# Patient Record
Sex: Female | Born: 1985 | Race: White | Hispanic: No | Marital: Married | Smoking: Never smoker
Health system: Southern US, Community
[De-identification: ages and names within clinical notes are randomized; demographics above are authoritative.]

## PROBLEM LIST (undated history)

## (undated) DIAGNOSIS — I959 Hypotension, unspecified: Secondary | ICD-10-CM

## (undated) DIAGNOSIS — Z789 Other specified health status: Secondary | ICD-10-CM

## (undated) HISTORY — DX: Other specified health status: Z78.9

## (undated) HISTORY — DX: Hypotension, unspecified: I95.9

---

## 2008-03-28 ENCOUNTER — Observation Stay: Payer: Self-pay | Admitting: Obstetrics and Gynecology

## 2008-04-23 ENCOUNTER — Observation Stay: Payer: Self-pay | Admitting: Obstetrics and Gynecology

## 2008-04-29 ENCOUNTER — Inpatient Hospital Stay: Payer: Self-pay | Admitting: Obstetrics and Gynecology

## 2011-06-13 ENCOUNTER — Ambulatory Visit: Payer: Self-pay | Admitting: Internal Medicine

## 2011-11-01 IMAGING — CR DG ABDOMEN 2V
1 series · 2 of 2 positions shown · non-contrast
Comparison: none

REASON FOR EXAM: RLQ pain and diarrhea
COMMENTS:

[Series 1: view not recorded · 0.17mm/px · 2 of 2 slices shown]
[im 1/2]
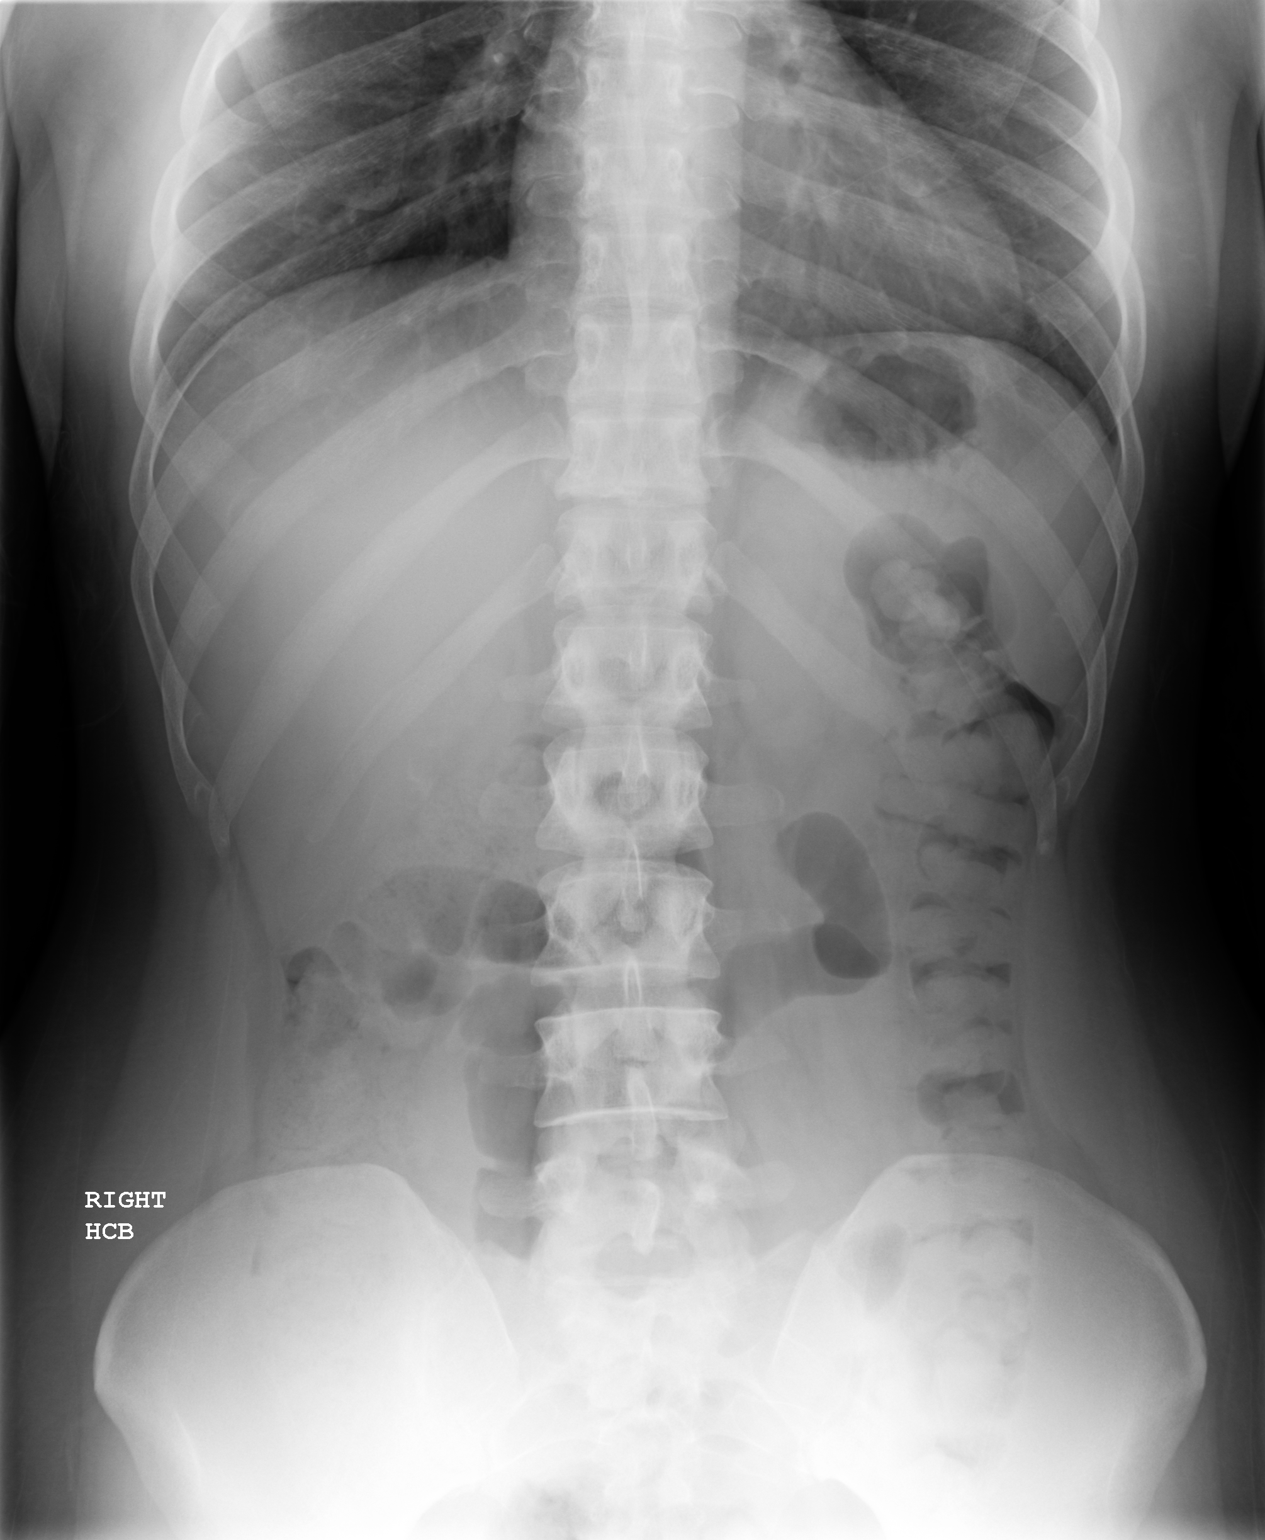
[im 2/2]
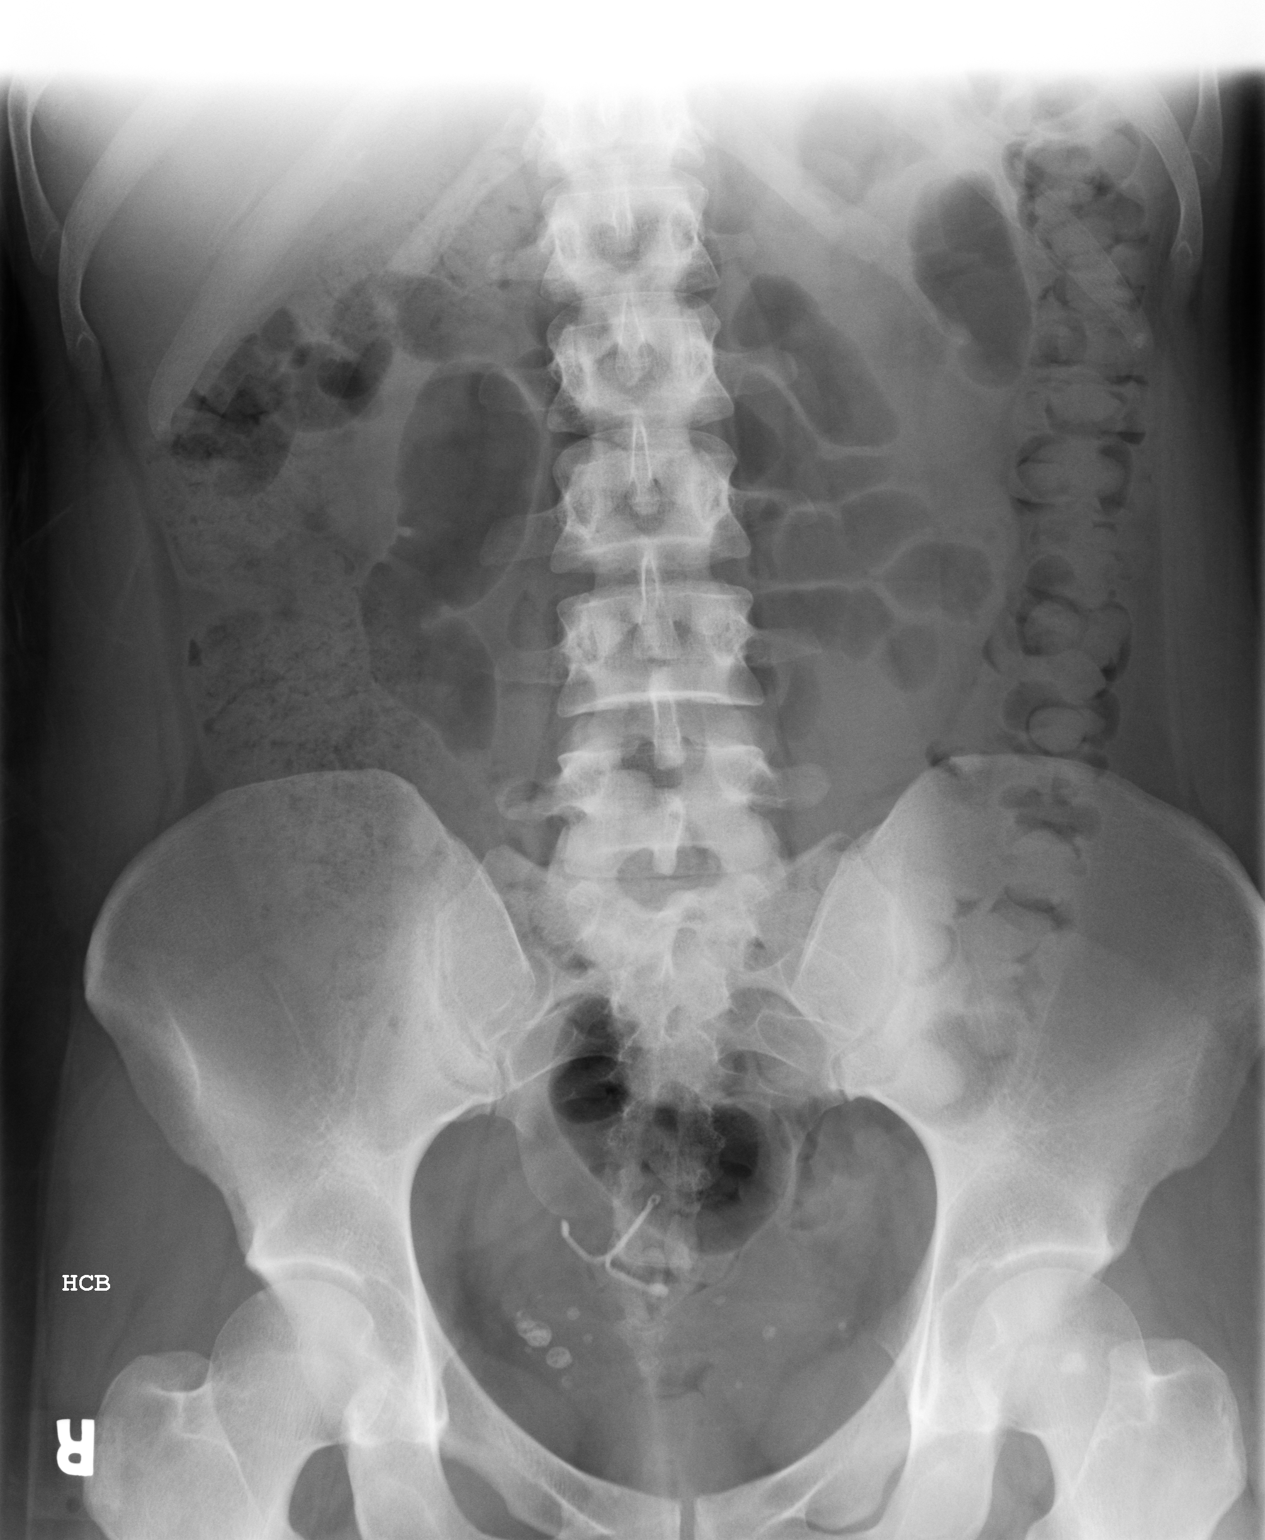

[2 of 2 positions shown; findings below may reference images not displayed]

PROCEDURE:     MDR - MDR ABDOMEN 2V FLAT AND ERECT  - June 13, 2011  [DATE]

RESULT:     Air is seen within nondilated loops of large and small bowel. A
moderate to slight large amount of stool is appreciated within the colon
primarily within ascending and descending portions of the colon. An
intrauterine device is identified within the pelvis as well as phleboliths.
The visualized bony skeleton is unremarkable.
IMPRESSION: Nonobstructive bowel gas pattern with a moderate to slightly large amount
stool.

## 2014-11-11 LAB — OB RESULTS CONSOLE HGB/HCT, BLOOD
HCT: 38 %
Hemoglobin: 12.8 g/dL

## 2014-11-11 LAB — OB RESULTS CONSOLE GC/CHLAMYDIA
Chlamydia: NEGATIVE
GC PROBE AMP, GENITAL: NEGATIVE

## 2014-11-11 LAB — OB RESULTS CONSOLE RPR: RPR: NONREACTIVE

## 2014-11-11 LAB — OB RESULTS CONSOLE PLATELET COUNT: PLATELETS: 196 10*3/uL

## 2014-11-11 LAB — OB RESULTS CONSOLE RUBELLA ANTIBODY, IGM: RUBELLA: IMMUNE

## 2014-11-11 LAB — OB RESULTS CONSOLE HEPATITIS B SURFACE ANTIGEN: Hepatitis B Surface Ag: NEGATIVE

## 2014-11-11 LAB — OB RESULTS CONSOLE HIV ANTIBODY (ROUTINE TESTING): HIV: NONREACTIVE

## 2014-11-11 LAB — OB RESULTS CONSOLE VARICELLA ZOSTER ANTIBODY, IGG: Varicella: IMMUNE

## 2014-11-11 LAB — OB RESULTS CONSOLE ANTIBODY SCREEN: ANTIBODY SCREEN: NEGATIVE

## 2014-11-11 LAB — OB RESULTS CONSOLE ABO/RH: RH Type: POSITIVE

## 2014-11-28 NOTE — L&D Delivery Note (Signed)
Delivery Summary for Beth Rubio  Labor Events:   Preterm labor:   Rupture date:   Rupture time:   Rupture type: Spontaneous  Fluid Color: Clear  Induction:   Augmentation:   Complications:   Cervical ripening:          Delivery:   Episiotomy:   Lacerations:   Repair suture:   Repair # of packets:   Blood loss (ml): 500 ml   Information for the patient's newborn:  Beth Rubio, Beth Rubio [[id number]][[id number]]    Delivery 06/09/2015 12:32 AM by   Sex:  female Gestational Age: 870w1d Delivery Clinician:   Living?: Yes        APGARS  One minute Five minutes Ten minutes  Skin color: 0   1      Heart rate: 2   2      Grimace: 2   2      Muscle tone: 2   2      Breathing: 2   2      Totals: 8  9      Presentation/position:      Resuscitation:   Cord information: 3 vessels   Disposition of cord blood:     Blood gases sent?  Complications:   Placenta: Delivered:       appearance Newborn Measurements: Weight: 8 lb 3.9 oz (3740 g)  Height: 21.26"  Head circumference: 34.5 cm  Chest circumference: 34.5 cm  Other providers:    Additional  information: Forceps:   Vacuum:   Breech:   Observed anomalies       See Operative Note for details of C-section procedure.   Beth LaserAnika Genie Wenke, MD [facility]

## 2015-02-24 LAB — OB RESULTS CONSOLE HGB/HCT, BLOOD
HEMATOCRIT: 33 %
Hemoglobin: 10.8 g/dL

## 2015-05-19 ENCOUNTER — Encounter: Payer: Self-pay | Admitting: Obstetrics and Gynecology

## 2015-05-19 ENCOUNTER — Ambulatory Visit (INDEPENDENT_AMBULATORY_CARE_PROVIDER_SITE_OTHER): Payer: BLUE CROSS/BLUE SHIELD | Admitting: Obstetrics and Gynecology

## 2015-05-19 VITALS — BP 98/66 | HR 78 | Ht 60.0 in | Wt 175.3 lb

## 2015-05-19 DIAGNOSIS — Z3483 Encounter for supervision of other normal pregnancy, third trimester: Secondary | ICD-10-CM

## 2015-05-19 DIAGNOSIS — Z369 Encounter for antenatal screening, unspecified: Secondary | ICD-10-CM

## 2015-05-19 DIAGNOSIS — O3421 Maternal care for scar from previous cesarean delivery: Secondary | ICD-10-CM

## 2015-05-19 DIAGNOSIS — O34219 Maternal care for unspecified type scar from previous cesarean delivery: Secondary | ICD-10-CM

## 2015-05-19 DIAGNOSIS — Z36 Encounter for antenatal screening of mother: Secondary | ICD-10-CM

## 2015-05-19 DIAGNOSIS — Z3493 Encounter for supervision of normal pregnancy, unspecified, third trimester: Secondary | ICD-10-CM

## 2015-05-19 LAB — POCT URINALYSIS DIPSTICK
Bilirubin, UA: NEGATIVE
Blood, UA: NEGATIVE
GLUCOSE UA: NEGATIVE
Ketones, UA: NEGATIVE
Nitrite, UA: NEGATIVE
PROTEIN UA: NEGATIVE
SPEC GRAV UA: 1.01
Urobilinogen, UA: 0.2
pH, UA: 6.5

## 2015-05-20 DIAGNOSIS — O34219 Maternal care for unspecified type scar from previous cesarean delivery: Secondary | ICD-10-CM | POA: Insufficient documentation

## 2015-05-20 DIAGNOSIS — R7309 Other abnormal glucose: Secondary | ICD-10-CM | POA: Insufficient documentation

## 2015-05-20 DIAGNOSIS — I959 Hypotension, unspecified: Secondary | ICD-10-CM | POA: Insufficient documentation

## 2015-05-20 NOTE — Progress Notes (Signed)
ROB: Patient doing well today.  Denies complaints.  For 36 week labs today.  Scheduled for repeat C-section at 39 weeks (06/12/2015).

## 2015-05-20 NOTE — Patient Instructions (Signed)
Labor precautions reviewed.  Call office or follow up at Labor and Delivery for the following: 1) Contractions less than 10 min apart for longer than 1 hour 2) Rupture of membranes 3) Vaginal bleeding requiring a pad 4) Decreased fetal movement  Scheduled for repeat C-section on 06/12/2015.

## 2015-05-21 LAB — CULTURE, BETA STREP (GROUP B ONLY)

## 2015-05-21 LAB — GC/CHLAMYDIA PROBE AMP
CHLAMYDIA, DNA PROBE: NEGATIVE
Neisseria gonorrhoeae by PCR: NEGATIVE

## 2015-05-26 ENCOUNTER — Ambulatory Visit (INDEPENDENT_AMBULATORY_CARE_PROVIDER_SITE_OTHER): Payer: BLUE CROSS/BLUE SHIELD | Admitting: Obstetrics and Gynecology

## 2015-05-26 ENCOUNTER — Encounter: Payer: Self-pay | Admitting: Obstetrics and Gynecology

## 2015-05-26 VITALS — BP 96/60 | HR 74 | Wt 174.2 lb

## 2015-05-26 DIAGNOSIS — Z369 Encounter for antenatal screening, unspecified: Secondary | ICD-10-CM

## 2015-05-26 DIAGNOSIS — Z3483 Encounter for supervision of other normal pregnancy, third trimester: Secondary | ICD-10-CM

## 2015-05-26 DIAGNOSIS — Z36 Encounter for antenatal screening of mother: Secondary | ICD-10-CM

## 2015-05-26 DIAGNOSIS — Z3493 Encounter for supervision of normal pregnancy, unspecified, third trimester: Secondary | ICD-10-CM

## 2015-05-26 DIAGNOSIS — O3421 Maternal care for scar from previous cesarean delivery: Secondary | ICD-10-CM

## 2015-05-26 DIAGNOSIS — O34219 Maternal care for unspecified type scar from previous cesarean delivery: Secondary | ICD-10-CM

## 2015-05-26 LAB — POCT URINALYSIS DIPSTICK
BILIRUBIN UA: NEGATIVE
GLUCOSE UA: NEGATIVE
KETONES UA: 80
Leukocytes, UA: NEGATIVE
Nitrite, UA: NEGATIVE
Protein, UA: NEGATIVE
SPEC GRAV UA: 1.01
Urobilinogen, UA: 0.2
pH, UA: 6

## 2015-05-26 NOTE — Progress Notes (Signed)
ROB: Patient doing well. Notes occasional contractions, mostly at night.  Reports feet swelling.  Advised on continued use of compression hose during the day, elevating feet in evening.  Will repeat GBS due to prior lab error.  GC/Cl negative. Will perform pre-op at next visit.

## 2015-05-31 LAB — CULTURE, BETA STREP (GROUP B ONLY): Strep Gp B Culture: NEGATIVE

## 2015-06-02 ENCOUNTER — Ambulatory Visit (INDEPENDENT_AMBULATORY_CARE_PROVIDER_SITE_OTHER): Payer: BLUE CROSS/BLUE SHIELD | Admitting: Obstetrics and Gynecology

## 2015-06-02 DIAGNOSIS — Z331 Pregnant state, incidental: Secondary | ICD-10-CM

## 2015-06-02 DIAGNOSIS — Z1389 Encounter for screening for other disorder: Secondary | ICD-10-CM

## 2015-06-02 LAB — POCT URINALYSIS DIPSTICK
Bilirubin, UA: NEGATIVE
Blood, UA: NEGATIVE
Glucose, UA: NEGATIVE
Ketones, UA: NEGATIVE
LEUKOCYTES UA: NEGATIVE
NITRITE UA: NEGATIVE
PH UA: 5
PROTEIN UA: NEGATIVE
SPEC GRAV UA: 1.01
UROBILINOGEN UA: 0.2

## 2015-06-02 NOTE — Progress Notes (Signed)
ROB: Patient doing well.  No complaints.  Labor precautions.  RTC in 1 week.  Scheduled for C-section 06/12/15. To do pre-op consent forms next visit. GBS neg.

## 2015-06-02 NOTE — Progress Notes (Signed)
ZIKA EXPOSURE SCREEN:  The patient has not traveled to a Zika Virus endemic area within the past 6 months, nor has she had unprotected sex with a partner who has travelled to a Zika endemic region within the past 6 months. The patient has been advised to notify us if these factors change any time during this current pregnancy, so adequate testing and monitoring can be initiated.  

## 2015-06-08 ENCOUNTER — Inpatient Hospital Stay: Payer: BLUE CROSS/BLUE SHIELD | Admitting: Anesthesiology

## 2015-06-08 ENCOUNTER — Encounter
Admission: EM | Disposition: A | Discharge status: Home / Self Care | Payer: Self-pay | Source: Ambulatory Visit | Attending: Obstetrics and Gynecology

## 2015-06-08 ENCOUNTER — Inpatient Hospital Stay
Admission: EM | Admit: 2015-06-08 | Discharge: 2015-06-11 | DRG: 766 | Disposition: A | Discharge status: Home / Self Care | Payer: BLUE CROSS/BLUE SHIELD | Source: Ambulatory Visit | Attending: Obstetrics and Gynecology | Admitting: Obstetrics and Gynecology

## 2015-06-08 DIAGNOSIS — N858 Other specified noninflammatory disorders of uterus: Secondary | ICD-10-CM | POA: Diagnosis present

## 2015-06-08 DIAGNOSIS — O4290 Premature rupture of membranes, unspecified as to length of time between rupture and onset of labor, unspecified weeks of gestation: Secondary | ICD-10-CM | POA: Diagnosis present

## 2015-06-08 DIAGNOSIS — Z3483 Encounter for supervision of other normal pregnancy, third trimester: Secondary | ICD-10-CM | POA: Diagnosis not present

## 2015-06-08 DIAGNOSIS — Z3A39 39 weeks gestation of pregnancy: Secondary | ICD-10-CM | POA: Diagnosis present

## 2015-06-08 DIAGNOSIS — O4292 Full-term premature rupture of membranes, unspecified as to length of time between rupture and onset of labor: Principal | ICD-10-CM | POA: Diagnosis present

## 2015-06-08 DIAGNOSIS — O3421 Maternal care for scar from previous cesarean delivery: Secondary | ICD-10-CM | POA: Diagnosis present

## 2015-06-08 DIAGNOSIS — O34219 Maternal care for unspecified type scar from previous cesarean delivery: Secondary | ICD-10-CM | POA: Diagnosis present

## 2015-06-08 LAB — CBC
HEMATOCRIT: 39.4 % (ref 35.0–47.0)
Hemoglobin: 13 g/dL (ref 12.0–16.0)
MCH: 29 pg (ref 26.0–34.0)
MCHC: 32.9 g/dL (ref 32.0–36.0)
MCV: 88.1 fL (ref 80.0–100.0)
Platelets: 126 10*3/uL — ABNORMAL LOW (ref 150–440)
RBC: 4.47 MIL/uL (ref 3.80–5.20)
RDW: 13.8 % (ref 11.5–14.5)
WBC: 11.3 10*3/uL — AB (ref 3.6–11.0)

## 2015-06-08 LAB — TYPE AND SCREEN
ABO/RH(D): A POS
ANTIBODY SCREEN: NEGATIVE

## 2015-06-08 SURGERY — Surgical Case
Anesthesia: Spinal

## 2015-06-08 MED ORDER — CEFAZOLIN SODIUM-DEXTROSE 2-3 GM-% IV SOLR
2.0000 g | INTRAVENOUS | Status: AC
  Administered 2015-06-08: 2 g via INTRAVENOUS
  Filled 2015-06-08 (×2): qty 50

## 2015-06-08 MED ORDER — OXYTOCIN BOLUS FROM INFUSION: 500.0000 mL | INTRAVENOUS | Status: DC

## 2015-06-08 MED ORDER — CITRIC ACID-SODIUM CITRATE 334-500 MG/5ML PO SOLN
30.0000 mL | ORAL | Status: DC | PRN
  Administered 2015-06-08: 30 mL via ORAL
  Filled 2015-06-08: qty 15

## 2015-06-08 MED ORDER — ONDANSETRON HCL 4 MG/2ML IJ SOLN: 4.0000 mg | Freq: Four times a day (QID) | INTRAMUSCULAR | Status: DC | PRN

## 2015-06-08 MED ORDER — ACETAMINOPHEN 325 MG PO TABS: 650.0000 mg | ORAL_TABLET | ORAL | Status: DC | PRN

## 2015-06-08 MED ORDER — LACTATED RINGERS IV SOLN
INTRAVENOUS | Status: DC
  Administered 2015-06-08 (×2): via INTRAVENOUS

## 2015-06-08 MED ORDER — OXYTOCIN 40 UNITS IN LACTATED RINGERS INFUSION - SIMPLE MED
62.5000 mL/h | INTRAVENOUS | Status: DC
  Administered 2015-06-09: 1000 mL via INTRAVENOUS

## 2015-06-08 MED ORDER — LACTATED RINGERS IV SOLN
500.0000 mL | INTRAVENOUS | Status: DC | PRN
Start: 2015-06-08 — End: 2015-06-09

## 2015-06-08 SURGICAL SUPPLY — 30 items
BAG COUNTER SPONGE EZ (MISCELLANEOUS) ×4 IMPLANT
CANISTER SUCT 3000ML (MISCELLANEOUS) ×3 IMPLANT
CHLORAPREP W/TINT 26ML (MISCELLANEOUS) ×6 IMPLANT
COUNTER SPONGE BAG EZ (MISCELLANEOUS) ×2
DRSG TELFA 3X8 NADH (GAUZE/BANDAGES/DRESSINGS) ×3 IMPLANT
ELECT CAUTERY BLADE 6.4 (BLADE) ×3 IMPLANT
GAUZE SPONGE 4X4 12PLY STRL (GAUZE/BANDAGES/DRESSINGS) ×3 IMPLANT
GLOVE BIO SURGEON STRL SZ 6 (GLOVE) ×3 IMPLANT
GLOVE BIOGEL M 7.0 STRL (GLOVE) ×6 IMPLANT
GLOVE BIOGEL PI IND STRL 6.5 (GLOVE) ×1 IMPLANT
GLOVE BIOGEL PI IND STRL 7.5 (GLOVE) ×2 IMPLANT
GLOVE BIOGEL PI INDICATOR 6.5 (GLOVE) ×2
GLOVE BIOGEL PI INDICATOR 7.5 (GLOVE) ×4
GOWN STRL REUS W/ TWL LRG LVL3 (GOWN DISPOSABLE) ×3 IMPLANT
GOWN STRL REUS W/TWL LRG LVL3 (GOWN DISPOSABLE) ×6
KIT RM TURNOVER STRD PROC AR (KITS) ×3 IMPLANT
MARKER SKIN W/RULER 31145785 (MISCELLANEOUS) IMPLANT
NS IRRIG 1000ML POUR BTL (IV SOLUTION) ×3 IMPLANT
PACK C SECTION AR (MISCELLANEOUS) ×3 IMPLANT
PAD GROUND ADULT SPLIT (MISCELLANEOUS) ×3 IMPLANT
PAD OB MATERNITY 4.3X12.25 (PERSONAL CARE ITEMS) ×3 IMPLANT
PAD PREP 24X41 OB/GYN DISP (PERSONAL CARE ITEMS) ×3 IMPLANT
SPONGE LAP 18X18 5 PK (GAUZE/BANDAGES/DRESSINGS) ×3 IMPLANT
SUT CHROMIC 0 CT 1 (SUTURE) IMPLANT
SUT MNCRL AB 4-0 PS2 18 (SUTURE) ×3 IMPLANT
SUT PLAIN 2 0 XLH (SUTURE) IMPLANT
SUT VIC AB 0 CT1 36 (SUTURE) ×6 IMPLANT
SUT VIC AB 1 CT1 36 (SUTURE) ×6 IMPLANT
SUT VIC AB 3-0 SH 27 (SUTURE) ×2
SUT VIC AB 3-0 SH 27X BRD (SUTURE) ×1 IMPLANT

## 2015-06-08 NOTE — Anesthesia Preprocedure Evaluation (Deleted)
Anesthesia Evaluation  Patient identified by MRN, date of birth, ID band Patient awake    Reviewed: Allergy & Precautions, NPO status , Patient's Chart, lab work & pertinent test results  History of Anesthesia Complications Negative for: history of anesthetic complications  Airway Mallampati: II  TM Distance: >3 FB Neck ROM: Full    Dental no notable dental hx.    Pulmonary neg pulmonary ROS,  breath sounds clear to auscultation  Pulmonary exam normal       Cardiovascular negative cardio ROS Normal cardiovascular examRhythm:Regular Rate:Normal     Neuro/Psych negative neurological ROS  negative psych ROS   GI/Hepatic negative GI ROS, Neg liver ROS,   Endo/Other  negative endocrine ROS  Renal/GU negative Renal ROS  negative genitourinary   Musculoskeletal negative musculoskeletal ROS (+)   Abdominal   Peds negative pediatric ROS (+)  Hematology negative hematology ROS (+)   Anesthesia Other Findings   Reproductive/Obstetrics negative OB ROS                             Anesthesia Physical Anesthesia Plan  ASA: II  Anesthesia Plan: Spinal   Post-op Pain Management:    Induction:   Airway Management Planned: Nasal Cannula  Additional Equipment:   Intra-op Plan:   Post-operative Plan:   Informed Consent: I have reviewed the patients History and Physical, chart, labs and discussed the procedure including the risks, benefits and alternatives for the proposed anesthesia with the patient or authorized representative who has indicated his/her understanding and acceptance.     Plan Discussed with: CRNA and Surgeon  Anesthesia Plan Comments:         Anesthesia Quick Evaluation

## 2015-06-08 NOTE — Anesthesia Preprocedure Evaluation (Signed)
Anesthesia Evaluation  Patient identified by MRN, date of birth, ID band Patient awake    Reviewed: Allergy & Precautions, NPO status , Patient's Chart, lab work & pertinent test results  History of Anesthesia Complications Negative for: history of anesthetic complications  Airway Mallampati: II  TM Distance: >3 FB Neck ROM: Full    Dental no notable dental hx.    Pulmonary neg pulmonary ROS,  breath sounds clear to auscultation  Pulmonary exam normal       Cardiovascular negative cardio ROS Normal cardiovascular examRhythm:Regular Rate:Normal     Neuro/Psych negative neurological ROS  negative psych ROS   GI/Hepatic negative GI ROS, Neg liver ROS,   Endo/Other  negative endocrine ROS  Renal/GU negative Renal ROS  negative genitourinary   Musculoskeletal negative musculoskeletal ROS (+)   Abdominal   Peds negative pediatric ROS (+)  Hematology negative hematology ROS (+)   Anesthesia Other Findings   Reproductive/Obstetrics (+) Pregnancy                            Anesthesia Physical Anesthesia Plan  ASA: II  Anesthesia Plan: Spinal   Post-op Pain Management:    Induction:   Airway Management Planned: Nasal Cannula  Additional Equipment:   Intra-op Plan:   Post-operative Plan:   Informed Consent: I have reviewed the patients History and Physical, chart, labs and discussed the procedure including the risks, benefits and alternatives for the proposed anesthesia with the patient or authorized representative who has indicated his/her understanding and acceptance.     Plan Discussed with: CRNA and Surgeon  Anesthesia Plan Comments:         Anesthesia Quick Evaluation  

## 2015-06-08 NOTE — H&P (Signed)
Obstetric History and Physical  Beth Rubio is a 29 y.o. G3P1011 with IUP at 653w0d, by LMP 09/08/2014, Estimated Date of Delivery: 06/15/15. Patient is  presenting for rupture of membranes. Patient states she has been having  Infrequent mild contractions, minimal vaginal bleeding, ruptured, clear fluid membranes, with active fetal movement.    Prenatal Course Source of Care: [facility] with onset of care at 12 weeks Pregnancy complications or risks: Patient Active Problem List   Diagnosis Date Noted  . PROM (premature rupture of membranes) 06/08/2015  . H/O cesarean section complicating pregnancy 05/20/2015  . Hypotension 05/20/2015  . Elevated glucose tolerance test 05/20/2015   She plans to breastfeed She desires oral contraceptives (estrogen/progesterone) for postpartum contraception.   Prenatal labs and studies: ABO, Rh: A/Positive/-- (12/15 2015) Antibody: Negative (12/15 2015) Rubella: Immune (12/15 2015) RPR: Nonreactive (12/15 2015)  HBsAg: Negative (12/15 2015)  HIV: Non-reactive (12/15 2015)  GBS: negative 1 hr Glucola  Elevated, 3 hr GTT normal.  Genetic screening normal Anatomy US normal  Prenatal Transfer Tool  Maternal Diabetes: No Genetic Screening: Normal Maternal Ultrasounds/Referrals: Normal Fetal Ultrasounds or other Referrals:  None Maternal Substance Abuse:  No Significant Maternal Medications:  None Significant Maternal Lab Results: None  Past Medical History  Diagnosis Date  . Hypotension   . Medical history non-contributory     Past Surgical History  Procedure Laterality Date  . Cesarean section  2009    OB History  Gravida Para Term Preterm AB SAB TAB Ectopic Multiple Living  3 1 1  1 1    1     # Outcome Date GA Lbr Len/2nd Weight Sex Delivery Anes PTL Lv  3 Current           2 Term 04/29/08 4119w2d  7 lb 9 oz (3.43 kg) M CS-LTranv   Y  1 SAB      SAB   FD      History   Social History  . Marital Status: Married   Spouse Name: N/A  . Number of Children: N/A  . Years of Education: N/A   Social History Main Topics  . Smoking status: Never Smoker   . Smokeless tobacco: Never Used  . Alcohol Use: No  . Drug Use: No  . Sexual Activity: Yes    Birth Control/ Protection: None     Comment: Pregnant   Other Topics Concern  . Not on file   Social History Narrative    Family History  Problem Relation Age of Onset  . Stroke Mother   . Breast cancer Mother     Prescriptions prior to admission  Medication Sig Dispense Refill Last Dose  . ferrous fumarate (HEMOCYTE - 106 MG FE) 325 (106 FE) MG TABS tablet Take 1 tablet by mouth.   Taking  . Prenatal Vit-Fe Fumarate-FA (MULTIVITAMIN-PRENATAL) 27-0.8 MG TABS tablet Take 1 tablet by mouth daily at 12 noon.   Taking    Allergies  Allergen Reactions  . Hydrocodone Nausea Only    Review of Systems: Negative except for what is mentioned in HPI.  Physical Exam: There were no vitals taken for this visit. CONSTITUTIONAL: Well-developed, well-nourished female in no acute distress.  HENT:  Normocephalic, atraumatic, External right and left ear normal. Oropharynx is clear and moist EYES: Conjunctivae and EOM are normal. Pupils are equal, round, and reactive to light. No scleral icterus.  NECK: Normal range of motion, supple, no masses SKIN: Skin is warm and dry. No rash  noted. Not diaphoretic. No erythema. No pallor. NEUROLGIC: Alert and oriented to person, place, and time. Normal reflexes, muscle tone coordination. No cranial nerve deficit noted. PSYCHIATRIC: Normal mood and affect. Normal behavior. Normal judgment and thought content. CARDIOVASCULAR: Normal heart rate noted, regular rhythm RESPIRATORY: Effort and breath sounds normal, no problems with respiration noted ABDOMEN: Soft, nontender, nondistended, gravid. MUSCULOSKELETAL: Normal range of motion. No edema and no tenderness. 2+ distal pulses.  Cervical Exam: Dilatation 1 cm   Effacement 30%    Station -3.  Grossly ruptured, clear fluid.   Presentation: cephalic FHT:  Baseline rate 145 bpm   Variability moderate  Accelerations present   Decelerations none Contractions: Every infrequent, q 8-15 min, mild   Pertinent Labs/Studies:   No results found for this or any previous visit (from the past 24 hour(s)).  Assessment : Beth Rubio is a 29 y.o. G3P1011 at [redacted]w[redacted]d being admitted for labor.  Plan: FWB: Reassuring fetal heart tracing.  GBS negative Delivery plan: H/o prior C-section x 1, desires repeat C-section.  Grossly ruptured.  Will proceed with repeat C-section.  Patient has been NPO since breakfast.      Hildred Laser, MD [facility]

## 2015-06-09 ENCOUNTER — Encounter: Payer: Self-pay | Admitting: *Deleted

## 2015-06-09 ENCOUNTER — Encounter: Payer: BLUE CROSS/BLUE SHIELD | Admitting: Obstetrics and Gynecology

## 2015-06-09 DIAGNOSIS — Z3483 Encounter for supervision of other normal pregnancy, third trimester: Secondary | ICD-10-CM

## 2015-06-09 LAB — CBC
HCT: 37 % (ref 35.0–47.0)
Hemoglobin: 12.2 g/dL (ref 12.0–16.0)
MCH: 29.1 pg (ref 26.0–34.0)
MCHC: 33 g/dL (ref 32.0–36.0)
MCV: 88.1 fL (ref 80.0–100.0)
Platelets: 109 10*3/uL — ABNORMAL LOW (ref 150–440)
RBC: 4.2 MIL/uL (ref 3.80–5.20)
RDW: 13.8 % (ref 11.5–14.5)
WBC: 13.4 10*3/uL — ABNORMAL HIGH (ref 3.6–11.0)

## 2015-06-09 LAB — ABO/RH: ABO/RH(D): A POS

## 2015-06-09 MED ORDER — EPHEDRINE SULFATE 50 MG/ML IJ SOLN
INTRAMUSCULAR | Status: DC | PRN
  Administered 2015-06-09: 10 mg via INTRAVENOUS

## 2015-06-09 MED ORDER — MEPERIDINE HCL 25 MG/ML IJ SOLN: 6.2500 mg | INTRAMUSCULAR | Status: DC | PRN

## 2015-06-09 MED ORDER — DEXTROSE 5 % IV SOLN: 1.0000 ug/kg/h | INTRAVENOUS | Status: DC | PRN

## 2015-06-09 MED ORDER — MENTHOL 3 MG MT LOZG: 1.0000 | LOZENGE | OROMUCOSAL | Status: DC | PRN

## 2015-06-09 MED ORDER — SENNOSIDES-DOCUSATE SODIUM 8.6-50 MG PO TABS
2.0000 | ORAL_TABLET | ORAL | Status: DC
  Administered 2015-06-09 – 2015-06-11 (×3): 2 via ORAL
  Filled 2015-06-09 (×3): qty 2

## 2015-06-09 MED ORDER — LANOLIN HYDROUS EX OINT: 1.0000 "application " | TOPICAL_OINTMENT | CUTANEOUS | Status: DC | PRN

## 2015-06-09 MED ORDER — MAGNESIUM HYDROXIDE 400 MG/5ML PO SUSP
30.0000 mL | ORAL | Status: DC | PRN
Start: 2015-06-09 — End: 2015-06-11
  Administered 2015-06-11: 30 mL via ORAL
  Filled 2015-06-09: qty 30

## 2015-06-09 MED ORDER — KETOROLAC TROMETHAMINE 30 MG/ML IJ SOLN: 30.0000 mg | Freq: Four times a day (QID) | INTRAMUSCULAR | Status: AC | PRN

## 2015-06-09 MED ORDER — BUPIVACAINE IN DEXTROSE 0.75-8.25 % IT SOLN
INTRATHECAL | Status: DC | PRN
  Administered 2015-06-09: 1.6 mL via INTRATHECAL

## 2015-06-09 MED ORDER — ONDANSETRON HCL 4 MG/2ML IJ SOLN: 4.0000 mg | Freq: Once | INTRAMUSCULAR | Status: DC | PRN

## 2015-06-09 MED ORDER — ONDANSETRON HCL 4 MG/2ML IJ SOLN: 4.0000 mg | Freq: Three times a day (TID) | INTRAMUSCULAR | Status: DC | PRN

## 2015-06-09 MED ORDER — DIPHENHYDRAMINE HCL 25 MG PO CAPS: 25.0000 mg | ORAL_CAPSULE | ORAL | Status: DC | PRN

## 2015-06-09 MED ORDER — OXYTOCIN 40 UNITS IN LACTATED RINGERS INFUSION - SIMPLE MED: 62.5000 mL/h | INTRAVENOUS | Status: DC

## 2015-06-09 MED ORDER — NALBUPHINE HCL 10 MG/ML IJ SOLN: 5.0000 mg | Freq: Once | INTRAMUSCULAR | Status: DC | PRN

## 2015-06-09 MED ORDER — SODIUM CHLORIDE 0.9 % IR SOLN
Status: DC | PRN
  Administered 2015-06-09: 200 mL

## 2015-06-09 MED ORDER — MORPHINE SULFATE (PF) 0.5 MG/ML IJ SOLN
INTRAMUSCULAR | Status: DC | PRN
  Administered 2015-06-09: .15 mg via INTRATHECAL

## 2015-06-09 MED ORDER — SIMETHICONE 80 MG PO CHEW
80.0000 mg | CHEWABLE_TABLET | ORAL | Status: DC | PRN
  Filled 2015-06-09 (×2): qty 1

## 2015-06-09 MED ORDER — NALBUPHINE HCL 10 MG/ML IJ SOLN: 5.0000 mg | INTRAMUSCULAR | Status: DC | PRN

## 2015-06-09 MED ORDER — LACTATED RINGERS IV SOLN
INTRAVENOUS | Status: DC
Start: 2015-06-09 — End: 2015-06-11

## 2015-06-09 MED ORDER — HYDROMORPHONE HCL 1 MG/ML IJ SOLN: 0.2500 mg | INTRAMUSCULAR | Status: DC | PRN

## 2015-06-09 MED ORDER — ZOLPIDEM TARTRATE 5 MG PO TABS: 5.0000 mg | ORAL_TABLET | Freq: Every evening | ORAL | Status: DC | PRN

## 2015-06-09 MED ORDER — WITCH HAZEL-GLYCERIN EX PADS: 1.0000 "application " | MEDICATED_PAD | CUTANEOUS | Status: DC | PRN

## 2015-06-09 MED ORDER — KETOROLAC TROMETHAMINE 30 MG/ML IJ SOLN
30.0000 mg | Freq: Four times a day (QID) | INTRAMUSCULAR | Status: DC | PRN
  Administered 2015-06-09 (×2): 30 mg via INTRAVENOUS
  Filled 2015-06-09 (×2): qty 1

## 2015-06-09 MED ORDER — MEASLES, MUMPS & RUBELLA VAC ~~LOC~~ INJ: 0.5000 mL | INJECTION | Freq: Once | SUBCUTANEOUS | Status: DC

## 2015-06-09 MED ORDER — PRENATAL MULTIVITAMIN CH
1.0000 | ORAL_TABLET | Freq: Every day | ORAL | Status: DC
  Administered 2015-06-09 – 2015-06-10 (×2): 1 via ORAL
  Filled 2015-06-09 (×2): qty 1

## 2015-06-09 MED ORDER — DIPHENHYDRAMINE HCL 50 MG/ML IJ SOLN: 12.5000 mg | INTRAMUSCULAR | Status: DC | PRN

## 2015-06-09 MED ORDER — SIMETHICONE 80 MG PO CHEW: 80.0000 mg | CHEWABLE_TABLET | ORAL | Status: DC

## 2015-06-09 MED ORDER — LIDOCAINE 5 % EX PTCH
MEDICATED_PATCH | CUTANEOUS | Status: DC | PRN
  Administered 2015-06-09: 1 via TRANSDERMAL

## 2015-06-09 MED ORDER — PHENYLEPHRINE HCL 10 MG/ML IJ SOLN
INTRAMUSCULAR | Status: DC | PRN
  Administered 2015-06-09 (×2): 100 ug via INTRAVENOUS

## 2015-06-09 MED ORDER — OXYCODONE-ACETAMINOPHEN 5-325 MG PO TABS
1.0000 | ORAL_TABLET | ORAL | Status: DC | PRN
  Filled 2015-06-09 (×2): qty 1

## 2015-06-09 MED ORDER — EPINEPHRINE HCL 1 MG/ML IJ SOLN: INTRAMUSCULAR | Status: DC | PRN

## 2015-06-09 MED ORDER — DIPHENHYDRAMINE HCL 25 MG PO CAPS: 25.0000 mg | ORAL_CAPSULE | Freq: Four times a day (QID) | ORAL | Status: DC | PRN

## 2015-06-09 MED ORDER — FENTANYL CITRATE (PF) 100 MCG/2ML IJ SOLN
INTRAMUSCULAR | Status: DC | PRN
  Administered 2015-06-09: 15 ug via INTRATHECAL

## 2015-06-09 MED ORDER — ACETAMINOPHEN 325 MG PO TABS: 650.0000 mg | ORAL_TABLET | ORAL | Status: DC | PRN

## 2015-06-09 MED ORDER — IBUPROFEN 600 MG PO TABS
600.0000 mg | ORAL_TABLET | Freq: Four times a day (QID) | ORAL | Status: DC
  Administered 2015-06-10 – 2015-06-11 (×6): 600 mg via ORAL
  Filled 2015-06-09 (×7): qty 1

## 2015-06-09 MED ORDER — LIDOCAINE 5 % EX PTCH
MEDICATED_PATCH | CUTANEOUS | Status: AC
  Filled 2015-06-09: qty 1

## 2015-06-09 MED ORDER — OXYCODONE-ACETAMINOPHEN 5-325 MG PO TABS: 2.0000 | ORAL_TABLET | ORAL | Status: DC | PRN

## 2015-06-09 MED ORDER — SODIUM CHLORIDE 0.9 % IJ SOLN: 3.0000 mL | INTRAMUSCULAR | Status: DC | PRN

## 2015-06-09 MED ORDER — FENTANYL CITRATE (PF) 100 MCG/2ML IJ SOLN: 25.0000 ug | INTRAMUSCULAR | Status: DC | PRN

## 2015-06-09 MED ORDER — OXYCODONE-ACETAMINOPHEN 5-325 MG PO TABS
1.0000 | ORAL_TABLET | ORAL | Status: DC | PRN
  Administered 2015-06-10: 1 via ORAL

## 2015-06-09 MED ORDER — DIBUCAINE 1 % RE OINT: 1.0000 "application " | TOPICAL_OINTMENT | RECTAL | Status: DC | PRN

## 2015-06-09 MED ORDER — NALOXONE HCL 0.4 MG/ML IJ SOLN: 0.4000 mg | INTRAMUSCULAR | Status: DC | PRN

## 2015-06-09 NOTE — Anesthesia Post-op Follow-up Note (Signed)
  Anesthesia Pain Follow-up Note  Patient: Beth KailRebecca M Rubio  Day #: 1  Date of Follow-up: 06/09/2015 Time: 7:33 AM  Last Vitals:  Filed Vitals:   06/09/15 0732  BP: 113/56  Pulse: 78  Temp: 36.9 C  Resp: 18    Level of Consciousness: alert  Pain: mild   Side Effects:None  Catheter Site Exam:clean, dry  Plan: D/C from anesthesia care  Rica MastBachich,  Nephtali Docken M

## 2015-06-09 NOTE — Anesthesia Postprocedure Evaluation (Signed)
  Anesthesia Post-op Note  Patient: Beth KailRebecca M Carrasco  Procedure(s) Performed: Procedure(s): CESAREAN SECTION (N/A)  Anesthesia type:Spinal  Patient location: 351  Post pain: Pain level controlled  Post assessment: Post-op Vital signs reviewed, Patient's Cardiovascular Status Stable, Respiratory Function Stable, Patent Airway and No signs of Nausea or vomiting  Post vital signs: Reviewed and stable  Last Vitals:  Filed Vitals:   06/09/15 0534  BP: 103/61  Pulse: 76  Temp: 36.4 C  Resp: 18    Level of consciousness: awake, alert  and patient cooperative  Complications: No apparent anesthesia complications

## 2015-06-09 NOTE — Op Note (Signed)
Cesarean Section Procedure Note  Indications: previous uterine incision (low transverse) and PROM  Pre-operative Diagnosis: 39 week 1 day pregnancy.  Post-operative Diagnosis: same  Procedure: Repeat low-transverse Cesarean section, escharotomy  Surgeon: Hildred LaserAnika Zeki Bedrosian, MD  Assistants: None  Anesthesia: Spinal anesthesia  ASA Class: 2   Procedure Details   The patient was seen in the Holding Room. The risks, benefits, complications, treatment options, and expected outcomes were discussed with the patient.  The patient concurred with the proposed plan, giving informed consent.  The site of surgery properly noted/marked. The patient was taken to the Operating Room, identified as Donovan KailRebecca M Federer and the procedure verified as C-Section Delivery. A Time Out was held and the above information confirmed.  After induction of anesthesia, the patient was draped and prepped in the usual sterile manner. A Pfannenstiel incision was made above and below the previous scar and an escharotomy was performed.  The incision was then carried down through the subcutaneous tissue to the fascia. Fascial incision was made and extended transversely. The fascia was separated from the underlying rectus tissue superiorly and inferiorly. The peritoneum was identified and entered. Peritoneal incision was extended longitudinally. The utero-vesical peritoneal reflection was incised transversely and the bladder flap was bluntly freed from the lower uterine segment. A low transverse uterine incision was made. Delivered from cephalic presentation was a 3740 gram Female with Apgar scores of 8 at one minute and 9 at five minutes. The umbilical cord was clamped and cut cord blood  And the infant was handed to the awaiting Pediatricians. The placenta was removed intact and appeared normal. The uterus was exteriorized. The uterine outline, tubes and ovaries appeared normal. The uterine incision was closed with a running locked suture of  0-Vicryl. An embricating layer was performed using a suture of 0-Vicryl in a running fashion. . Hemostasis was observed. The uterus was returned to the abdomen.  The pericolic gutters were cleared of all clots and debris. The fascia was then reapproximated with running sutures of 1-0 Vicryl. The subcutaneous fat tissue layer was closed with a running suture of 3-0 vicryl. The skin was reapproximated with 4-0Monocryl.  Instrument, sponge, and needle counts were correct prior the abdominal closure and at the conclusion of the case.   Findings: Female infant in cephalic presentation, 3740 grams, with Apgar scores of 8 at one minute and 9 at five minutes.  Normal appearing uterus, tubes, and ovaries bilaterally.  Normal placenta with 3 vessel cord.  Estimated Blood Loss:  500 ml         Drains: foley catheter to gravity, 50 ml at end of procedure.          Total IV Fluids:  500 ml         Specimens: Placenta and Disposition:  Sent to Pathology   Implants: None         Complications:  None; patient tolerated the procedure well.         Disposition: PACU - hemodynamically stable.         Condition: stable    Hildred LaserAnika Ellana Kawa, MD [facility]

## 2015-06-09 NOTE — Transfer of Care (Signed)
Immediate Anesthesia Transfer of Care Note  Patient: Beth KailRebecca M Rubio  Procedure(s) Performed: Procedure(s): CESAREAN SECTION (N/A)  Patient Location: PACU  Anesthesia Type:Spinal  Level of Consciousness: awake, alert , oriented and patient cooperative  Airway & Oxygen Therapy: Patient Spontanous Breathing  Post-op Assessment: Report given to RN and Post -op Vital signs reviewed and stable  Post vital signs: Reviewed and stable  Last Vitals:  Filed Vitals:   06/08/15 2257  BP: 104/61  Pulse: 91  Temp:   Resp:     Complications: No apparent anesthesia complications

## 2015-06-10 ENCOUNTER — Inpatient Hospital Stay: Admission: RE | Admit: 2015-06-10 | Payer: Self-pay | Source: Ambulatory Visit

## 2015-06-10 MED ORDER — IBUPROFEN 800 MG PO TABS
800.0000 mg | ORAL_TABLET | Freq: Three times a day (TID) | ORAL | Status: DC | PRN
Start: 2015-06-10 — End: 2015-10-27

## 2015-06-10 MED ORDER — OXYCODONE-ACETAMINOPHEN 5-325 MG PO TABS: 1.0000 | ORAL_TABLET | Freq: Four times a day (QID) | ORAL | Status: DC | PRN

## 2015-06-10 MED ORDER — DOCUSATE SODIUM 100 MG PO CAPS: 100.0000 mg | ORAL_CAPSULE | Freq: Two times a day (BID) | ORAL | Status: DC | PRN

## 2015-06-10 MED ORDER — SIMETHICONE 80 MG PO CHEW
80.0000 mg | CHEWABLE_TABLET | ORAL | Status: DC
  Administered 2015-06-10 – 2015-06-11 (×2): 80 mg via ORAL

## 2015-06-10 NOTE — Progress Notes (Signed)
Subjective: Postpartum Day 1: Cesarean Delivery Patient reports tolerating PO, + flatus and no problems voiding.  Pain controlled with pain meds.   Objective: Vital signs in last 24 hours: Temp:  [97.7 F (36.5 C)-98.8 F (37.1 C)] 98.7 F (37.1 C) (07/13 1213) Pulse Rate:  [68-74] 74 (07/13 1213) Resp:  [18-20] 18 (07/13 1213) BP: (104-110)/(63-70) 104/63 mmHg (07/13 1213) SpO2:  [100 %] 100 % (07/12 1927)  Physical Exam:  General: alert and no distress Lochia: appropriate Uterine Fundus: firm Incision: healing well, no significant drainage, no dehiscence, no significant erythema DVT Evaluation: No evidence of DVT seen on physical exam. No cords or calf tenderness. No significant calf/ankle edema.   Recent Labs  06/08/15 2132 06/09/15 0643  HGB 13.0 12.2  HCT 39.4 37.0    Assessment/Plan: Status post Cesarean section. Doing well postoperatively.  Continue current care. Likely d/c home tomorrow.   Beth Rubio 06/10/2015, 1:05 PM

## 2015-06-11 ENCOUNTER — Inpatient Hospital Stay
Admission: RE | Admit: 2015-06-11 | Payer: BLUE CROSS/BLUE SHIELD | Source: Ambulatory Visit | Admitting: Obstetrics and Gynecology

## 2015-06-11 ENCOUNTER — Encounter: Admission: RE | Payer: Self-pay | Source: Ambulatory Visit

## 2015-06-11 ENCOUNTER — Encounter: Payer: BLUE CROSS/BLUE SHIELD | Admitting: Obstetrics and Gynecology

## 2015-06-11 LAB — RPR: RPR: NONREACTIVE

## 2015-06-11 LAB — HIV ANTIBODY (ROUTINE TESTING W REFLEX): HIV Screen 4th Generation wRfx: NONREACTIVE

## 2015-06-11 SURGERY — Surgical Case
Anesthesia: Choice

## 2015-06-11 NOTE — Discharge Summary (Signed)
Obstetric Discharge Summary Reason for Admission: rupture of membranes and h/o prior C-section Prenatal Procedures: ultrasound Intrapartum Procedures: cesarean: low cervical, transverse Postpartum Procedures: none Complications-Operative and Postpartum: none   HEMOGLOBIN  Date Value Ref Range Status  06/09/2015 12.2 12.0 - 16.0 g/dL Final  65/78/469603/29/2016 29.510.8 g/dL Final   HCT  Date Value Ref Range Status  06/09/2015 37.0 35.0 - 47.0 % Final  02/24/2015 33 % Final    Physical Exam:  General: alert and no distress Lochia: appropriate Uterine Fundus: firm Incision: healing well, no significant drainage, no dehiscence, no significant erythema DVT Evaluation: No evidence of DVT seen on physical exam. No cords or calf tenderness. No significant calf/ankle edema.  Discharge Diagnoses: Term Pregnancy-delivered  Discharge Information: Date: 06/11/2015 Activity: pelvic rest x 6 weeks Diet: routine Medications: PNV, Ibuprofen, Colace and Percocet Condition: stable Instructions: refer to practice specific booklet Discharge to: home Follow-up Information    Follow up with Beth LaserAnika Jakaria Lavergne, MD In 1 week.   Specialties:  Obstetrics and Gynecology, Radiology   Why:  For wound re-check   Contact information:   [address] [place] [id number] [phone]       Newborn Data: Live born female  Birth Weight: 8 lb 3.9 oz (3740 g) APGAR: 8, 9  Home with mother.  Beth Rubio 06/11/2015, 8:40 AM

## 2015-06-11 NOTE — Progress Notes (Signed)
Subjective: Postpartum Day 2: Cesarean Delivery Patient reports tolerating PO, + flatus and no problems voiding.  Pain controlled with pain meds.   Objective: Vital signs in last 24 hours: Temp:  [98.3 F (36.8 C)-98.7 F (37.1 C)] 98.3 F (36.8 C) (07/14 0500) Pulse Rate:  [74-78] 78 (07/13 1934) Resp:  [18] 18 (07/13 1934) BP: (100-104)/(56-63) 100/56 mmHg (07/13 1934)  Physical Exam:  General: alert and no distress Lochia: appropriate Uterine Fundus: firm Incision: healing well, no significant drainage, no dehiscence, no significant erythema DVT Evaluation: No evidence of DVT seen on physical exam. No cords or calf tenderness. No significant calf/ankle edema.   Recent Labs  06/08/15 2132 06/09/15 0643  HGB 13.0 12.2  HCT 39.4 37.0    Assessment/Plan: Status post Cesarean section. Doing well postoperatively.  Continue current care. Infnat s/p circumcision.  Desires OCPs for contraception.  D/c home tomorrow today.   Beth Rubio 06/11/2015, 8:39 AM

## 2015-06-11 NOTE — Progress Notes (Signed)
Patient discharge to home via wheelchair with spouse and baby in car seat.  

## 2015-06-16 ENCOUNTER — Ambulatory Visit (INDEPENDENT_AMBULATORY_CARE_PROVIDER_SITE_OTHER): Payer: BLUE CROSS/BLUE SHIELD | Admitting: Obstetrics and Gynecology

## 2015-06-16 VITALS — BP 120/80 | HR 85 | Ht 60.0 in | Wt 157.4 lb

## 2015-06-16 DIAGNOSIS — Z4889 Encounter for other specified surgical aftercare: Secondary | ICD-10-CM

## 2015-06-16 DIAGNOSIS — Z98891 History of uterine scar from previous surgery: Secondary | ICD-10-CM

## 2015-06-16 DIAGNOSIS — Z9889 Other specified postprocedural states: Secondary | ICD-10-CM

## 2015-06-16 NOTE — Progress Notes (Signed)
Subjective:     Beth KailRebecca M Rubio presents to the clinic 1 weeks following repeat low transvsere C-section. Eating a regular diet without difficulty. Bowel movements are Normal.  Pain is controlled with current analgesics. Medications being used: ibuprofen (OTC)..    Objective:    BP 120/80 mmHg  Pulse 85  Ht 5' (1.524 m)  Wt 157 lb 6.4 oz (71.396 kg)  BMI 30.74 kg/m2  General:  alert and no distress  Abdomen: soft, bowel sounds active, non-tender  Incision:   healing well, no drainage, no erythema, no hernia, no seroma, no swelling, no dehiscence, incision well approximated. Steri-strips in place.      Assessment:    Doing well postoperatively.    Plan:    1. Continue any current medications. 2. Wound care discussed. 3. Pt is to increase activities as tolerated.  - May drive starting today.  - May return to full duty in 5 weeks. 4. Follow up: 5 weeks for postpartum visit.      Hildred LaserAnika Keoni Risinger, MD [facility]

## 2015-07-21 ENCOUNTER — Ambulatory Visit (INDEPENDENT_AMBULATORY_CARE_PROVIDER_SITE_OTHER): Payer: BLUE CROSS/BLUE SHIELD | Admitting: Obstetrics and Gynecology

## 2015-07-21 MED ORDER — DROSPIRENONE-ETHINYL ESTRADIOL 3-0.02 MG PO TABS
1.0000 | ORAL_TABLET | Freq: Every day | ORAL | Status: DC
Start: 2015-07-21 — End: 2015-09-17

## 2015-07-21 NOTE — Progress Notes (Signed)
Subjective:     Beth Rubio is a 29 y.o. [phone] female who presents for a postpartum visit. She is 6 weeks postpartum following a repeat low cervical transverse Cesarean section. I have fully reviewed the prenatal and intrapartum course. The delivery was at 39 gestational weeks.  Anesthesia: spinal. Postpartum course has been well. Baby's course has been well. Baby is feeding by breast. Bleeding no bleeding. Bowel function is normal. Bladder function is normal. Patient is not sexually active. Contraception method is OCP (estrogen/progesterone) and plans for vasectomy. Postpartum depression screening: negative.  The following portions of the patient's history were reviewed and updated as appropriate: allergies, current medications, past family history, past medical history, past social history, past surgical history and problem list.  Review of Systems A comprehensive review of systems was negative.   Objective:    BP 101/69 mmHg  Pulse 74  Resp 16  Ht 5' (1.524 m)  Wt 124 lb 9.6 oz (56.518 kg)  BMI 24.33 kg/m2  General:  alert and no distress   Breasts:  inspection negative, no nipple discharge or bleeding, no masses or nodularity palpable  Lungs: clear to auscultation bilaterally  Heart:  regular rate and rhythm, S1, S2 normal, no murmur, click, rub or gallop  Abdomen: soft, non-tender; bowel sounds normal; no masses,  no organomegaly   Vulva:  normal  Vagina: normal vagina, no discharge, exudate, lesion, or erythema  Cervix:  no cervical motion tenderness and no lesions  Corpus: normal size, contour, position, consistency, mobility, non-tender  Adnexa:  not evaluated  Rectal Exam: Not performed.         Lab Results  Component Value Date   HGB 12.2 06/09/2015    Assessment:     Routine postpartum exam. Pap smear not done at today's visit.   Plan:    1. Contraception: OCP (estrogen/progesterone) and plans for husband's vasectomy in the near future.  2.  Follow up in: 3  months for annual exam or as needed.

## 2015-09-17 ENCOUNTER — Telehealth: Payer: Self-pay | Admitting: Obstetrics and Gynecology

## 2015-09-17 MED ORDER — NORGESTIMATE-ETH ESTRADIOL 0.25-35 MG-MCG PO TABS: 1.0000 | ORAL_TABLET | Freq: Every day | ORAL | Status: DC

## 2015-09-17 NOTE — Telephone Encounter (Signed)
Pt called and she came in for her 6 weeks post partum visit she thinks in august and was put on [employer] [place] but she can not remember the name of it, and she thinks she is having an allergic reaction, after she takes it she get itchy and she has gotten a rash on her wrist and some little bumps on her arm, but nothing on the trunk of her body, no SOB but she stated her chest sometimes feels tight but she can breathe fine, she is taking it right now at night, but this is the only thing new that she has started and as soon after she takes it that when she feels itchy. Pt would like a call back.

## 2015-09-17 NOTE — Addendum Note (Signed)
Addended by: Fabian NovemberHERRY, Anuja Manka S on: 09/17/2015 01:42 PM   Modules accepted: Orders, Medications

## 2015-09-17 NOTE — Telephone Encounter (Signed)
Ok.  Please ask her to discontinue current OCP.  Will call in a different one (Sprintec).   If she is on  1st week of cycle she can start new pack right away.  If she is further into her current pill pack or near the end, can start new pack after next period.

## 2015-09-17 NOTE — Telephone Encounter (Signed)
Left message to contact office and to stop taking the medication.

## 2015-09-18 ENCOUNTER — Telehealth: Payer: Self-pay | Admitting: Obstetrics and Gynecology

## 2015-09-18 NOTE — Telephone Encounter (Signed)
PT CALLED AND SHE HAD CALLED YESTERDAY i SENT MESSAGE TO [name], SHE THINKS SHE IS HAVING AN ALLERGIC REACTION TO THE BC SHE WAS PUT ON, ITCHY, RASH AROUND WRIST, SMALL RED BUMPS ON ARMS, [name] CALLED HER BACK YESTERDAY ABOUT 4:30 BUT SHE DID NOT ANSWER SHE CALLED THIS MORNING WANTING TO KNOW WHAT [name] MIGHT WAS GOING TO TELL HER. WHEN I PULLED MESSAGE THERE WAS NO OTHER TELEPHONE MESSAGE FOR ME TO LINK THIS TO, SO  I DONT KNOW IF [name] CLOSED OUT TELEPHONE ENCOUNTER.

## 2015-09-21 NOTE — Telephone Encounter (Signed)
Left message to contact office

## 2015-09-22 NOTE — Telephone Encounter (Signed)
See previous telephone message for 09/17/15. Called pt x2, lmtco.

## 2015-10-27 ENCOUNTER — Encounter: Payer: Self-pay | Admitting: Obstetrics and Gynecology

## 2015-10-27 ENCOUNTER — Ambulatory Visit (INDEPENDENT_AMBULATORY_CARE_PROVIDER_SITE_OTHER): Payer: BLUE CROSS/BLUE SHIELD | Admitting: Obstetrics and Gynecology

## 2015-10-27 VITALS — BP 119/77 | HR 100 | Ht 60.0 in | Wt 160.0 lb

## 2015-10-27 DIAGNOSIS — Z803 Family history of malignant neoplasm of breast: Secondary | ICD-10-CM

## 2015-10-27 DIAGNOSIS — Z0000 Encounter for general adult medical examination without abnormal findings: Secondary | ICD-10-CM | POA: Diagnosis not present

## 2015-10-27 DIAGNOSIS — Z01419 Encounter for gynecological examination (general) (routine) without abnormal findings: Secondary | ICD-10-CM

## 2015-10-27 DIAGNOSIS — Z23 Encounter for immunization: Secondary | ICD-10-CM | POA: Diagnosis not present

## 2015-10-27 MED ORDER — LEVONORGESTREL-ETHINYL ESTRAD 0.1-20 MG-MCG PO TABS
1.0000 | ORAL_TABLET | Freq: Every day | ORAL | Status: DC
Start: 2015-10-27 — End: 2017-01-10

## 2015-10-27 NOTE — Progress Notes (Addendum)
GYNECOLOGY ANNUAL EXAM CLINIC PROGRESS NOTE   Subjective:    Beth Rubio is a 29 y.o. [phone] female who presents for an annual exam. The patient has complaints today of skin rash and chest tightness while taking OCPs. The patient is sexually active, 1 partner (spouse). GYN screening history: last pap: approximate date 06/2014 and was normal. The patient wears seatbelts: yes. The patient participates in regular exercise: no. Has the patient ever been transfused or tattooed?: no. The patient reports that there is not domestic violence in her life.   Menstrual History: Obstetric History   G3   P2   T2   P0   A1   TAB0   SAB1   E0   M0   L2     # Outcome Date GA Lbr Len/2nd Weight Sex Delivery Anes PTL Lv  3 Term 06/09/15 [redacted]w[redacted]d  8 lb 3.9 oz (3.74 kg) M    Y     Apgar1:  8                Apgar5: 9  2 Term 04/29/08 [redacted]w[redacted]d  7 lb 9 oz (3.43 kg) M CS-LTranv   Y  1 SAB      SAB   FD     Menarche age:70 No LMP recorded. Patient is not currently having periods (Reason: Lactating).  Denies h/o abnormal pap smears or STIs.    Past Medical History  Diagnosis Date  . Hypotension   . Medical history non-contributory     Family History  Problem Relation Age of Onset  . Stroke Mother   . Breast cancer Mother     Past Surgical History  Procedure Laterality Date  . Cesarean section  2009, 2016     Social History   Social History  . Marital Status: Married    Spouse Name: N/A  . Number of Children: N/A  . Years of Education: N/A   Occupational History  . Not on file.   Social History Main Topics  . Smoking status: Never Smoker   . Smokeless tobacco: Never Used  . Alcohol Use: No  . Drug Use: No  . Sexual Activity: Yes    Birth Control/ Protection: Pill     Comment: Pregnant   Other Topics Concern  . Not on file   Social History Narrative    Outpatient Encounter Prescriptions as of 10/27/2015  Medication Sig Note  . metoCLOPramide (REGLAN) 10 MG tablet Take 10 mg by  mouth 3 (three) times daily. 10/27/2015: Received from: External Pharmacy  . norgestimate-ethinyl estradiol (ORTHO-CYCLEN,SPRINTEC,PREVIFEM) 0.25-35 MG-MCG tablet Take 1 tablet by mouth daily.    No facility-administered encounter medications on file as of 10/27/2015.     Allergies  Allergen Reactions  . Hydrocodone Nausea Only     Review of Systems Constitutional: negative for chills, fatigue, fevers and sweats Eyes: negative for irritation, redness and visual disturbance Ears, nose, mouth, throat, and face: negative for hearing loss, nasal congestion, snoring and tinnitus Respiratory: negative for asthma, cough, sputum Cardiovascular: negative for chest pain, dyspnea, exertional chest pressure/discomfort, irregular heart beat, palpitations and syncope.  Positive for intermittent chest tightness.  Gastrointestinal: negative for abdominal pain, change in bowel habits, nausea and vomiting Genitourinary: negative for abnormal menstrual periods, genital lesions, sexual problems and vaginal discharge, dysuria and urinary incontinence Integument/breast: negative for breast lump, breast tenderness and nipple discharge.  Positive for rash on arms Hematologic/lymphatic: negative for bleeding and easy bruising Musculoskeletal:negative for back pain and muscle  weakness Neurological: negative for dizziness, headaches, vertigo and weakness Endocrine: negative for diabetic symptoms including polydipsia, polyuria and skin dryness. Positive for acne.  Allergic/Immunologic: negative for hay fever and urticaria     Objective:  Blood pressure 119/77, pulse 100, height 5' (1.524 m), weight 160 lb (72.576 kg), currently breastfeeding. Body mass index is 31.25 kg/(m^2).    General Appearance:    Alert, cooperative, no distress, appears stated age  Head:    Normocephalic, without obvious abnormality, atraumatic  Eyes:    PERRL, conjunctiva/corneas clear, EOM's intact, both eyes  Ears:    Normal external  ear canals, both ears  Nose:   Nares normal, septum midline, mucosa normal, no drainage or sinus tenderness  Throat:   Lips, mucosa, and tongue normal; teeth and gums normal  Neck:   Supple, symmetrical, trachea midline, no adenopathy; thyroid: no enlargement/tenderness/nodules; no carotid bruit or JVD  Back:     Symmetric, no curvature, ROM normal, no CVA tenderness  Lungs:     Clear to auscultation bilaterally, respirations unlabored  Chest Wall:    No tenderness or deformity   Heart:    Regular rate and rhythm, S1 and S2 normal, no murmur, rub or gallop  Breast Exam:    No tenderness, masses, or nipple abnormality  Abdomen:     Soft, non-tender, bowel sounds active all four quadrants, no masses, no organomegaly.   Genitalia:    Pelvic:external genitalia normal, vagina without lesions, discharge, or tenderness, rectovaginal septum  normal. Cervix normal in appearance, no cervical motion tenderness, no adnexal masses or tenderness. Uterus normal size, shape, consistency.    Rectal:    Normal external sphincter.  No hemorrhoids appreciated. Internal exam not done.   Extremities:   Extremities normal, atraumatic, no cyanosis or edema  Pulses:   2+ and symmetric all extremities  Skin:   Skin color, texture, turgor normal, or lesions.  Faint macular rash noted on bilateral forearms.   Lymph nodes:   Cervical, supraclavicular, and axillary nodes normal  Neurologic:   CNII-XII intact, normal strength, sensation and reflexes throughout      Assessment:    Healthy female exam.   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 Breast self exam technique reviewed and patient encouraged to perform self-exam monthly. Contraception: OCP (estrogen/progesterone).  Will change type of OCP to decrease side effects of rash and chest tightness.   Patient also know to have sporadic rashes  during pregnancy.  Possibly due to repetitive glove use at work.  Advised against use of latex gloves.  Can use OTC hydrocortisone cream as needed.  Discussed healthy lifestyle modifications. Family h/o breast cancer (mother) - further discussion had on risk of breast cancer Received flu vaccine today.  Follow up in 1 year for annual exam, or sooner as needed.        Hildred LaserAnika Izzy Doubek, MD [facility]

## 2015-10-27 NOTE — Patient Instructions (Signed)
Health Maintenance, Female Adopting a healthy lifestyle and getting preventive care can go a long way to promote health and wellness. Talk with your health care provider about what schedule of regular examinations is right for you. This is a good chance for you to check in with your provider about disease prevention and staying healthy. In between checkups, there are plenty of things you can do on your own. Experts have done a lot of research about which lifestyle changes and preventive measures are most likely to keep you healthy. Ask your health care provider for more information. WEIGHT AND DIET  Eat a healthy diet  Be sure to include plenty of vegetables, fruits, low-fat dairy products, and lean protein.  Do not eat a lot of foods high in solid fats, added sugars, or salt.  Get regular exercise. This is one of the most important things you can do for your health.  Most adults should exercise for at least 150 minutes each week. The exercise should increase your heart rate and make you sweat (moderate-intensity exercise).  Most adults should also do strengthening exercises at least twice a week. This is in addition to the moderate-intensity exercise.  Maintain a healthy weight  Body mass index (BMI) is a measurement that can be used to identify possible weight problems. It estimates body fat based on height and weight. Your health care provider can help determine your BMI and help you achieve or maintain a healthy weight.  For females 28 years of age and older:   A BMI below 18.5 is considered underweight.  A BMI of 18.5 to 24.9 is normal.  A BMI of 25 to 29.9 is considered overweight.  A BMI of 30 and above is considered obese.  Watch levels of cholesterol and blood lipids  You should start having your blood tested for lipids and cholesterol at 31 years of age, then have this test every 5 years.  You may need to have your cholesterol levels checked more often if:  Your lipid  or cholesterol levels are high.  You are older than 31 years of age.  You are at high risk for heart disease.  CANCER SCREENING   Lung Cancer  Lung cancer screening is recommended for adults 75-66 years old who are at high risk for lung cancer because of a history of smoking.  A yearly low-dose CT scan of the lungs is recommended for people who:  Currently smoke.  Have quit within the past 15 years.  Have at least a 30-pack-year history of smoking. A pack year is smoking an average of one pack of cigarettes a day for 1 year.  Yearly screening should continue until it has been 15 years since you quit.  Yearly screening should stop if you develop a health problem that would prevent you from having lung cancer treatment.  Breast Cancer  Practice breast self-awareness. This means understanding how your breasts normally appear and feel.  It also means doing regular breast self-exams. Let your health care provider know about any changes, no matter how small.  If you are in your 20s or 30s, you should have a clinical breast exam (CBE) by a health care provider every 1-3 years as part of a regular health exam.  If you are 25 or older, have a CBE every year. Also consider having a breast X-ray (mammogram) every year.  If you have a family history of breast cancer, talk to your health care provider about genetic screening.  If you  are at high risk for breast cancer, talk to your health care provider about having an MRI and a mammogram every year.  Breast cancer gene (BRCA) assessment is recommended for women who have family members with BRCA-related cancers. BRCA-related cancers include:  Breast.  Ovarian.  Tubal.  Peritoneal cancers.  Results of the assessment will determine the need for genetic counseling and BRCA1 and BRCA2 testing. Cervical Cancer Your health care provider may recommend that you be screened regularly for cancer of the pelvic organs (ovaries, uterus, and  vagina). This screening involves a pelvic examination, including checking for microscopic changes to the surface of your cervix (Pap test). You may be encouraged to have this screening done every 3 years, beginning at age 21.  For women ages 30-65, health care providers may recommend pelvic exams and Pap testing every 3 years, or they may recommend the Pap and pelvic exam, combined with testing for human papilloma virus (HPV), every 5 years. Some types of HPV increase your risk of cervical cancer. Testing for HPV may also be done on women of any age with unclear Pap test results.  Other health care providers may not recommend any screening for nonpregnant women who are considered low risk for pelvic cancer and who do not have symptoms. Ask your health care provider if a screening pelvic exam is right for you.  If you have had past treatment for cervical cancer or a condition that could lead to cancer, you need Pap tests and screening for cancer for at least 20 years after your treatment. If Pap tests have been discontinued, your risk factors (such as having a new sexual partner) need to be reassessed to determine if screening should resume. Some women have medical problems that increase the chance of getting cervical cancer. In these cases, your health care provider may recommend more frequent screening and Pap tests. Colorectal Cancer  This type of cancer can be detected and often prevented.  Routine colorectal cancer screening usually begins at 31 years of age and continues through 31 years of age.  Your health care provider may recommend screening at an earlier age if you have risk factors for colon cancer.  Your health care provider may also recommend using home test kits to check for hidden blood in the stool.  A small camera at the end of a tube can be used to examine your colon directly (sigmoidoscopy or colonoscopy). This is done to check for the earliest forms of colorectal  cancer.  Routine screening usually begins at age 50.  Direct examination of the colon should be repeated every 5-10 years through 31 years of age. However, you may need to be screened more often if early forms of precancerous polyps or small growths are found. Skin Cancer  Check your skin from head to toe regularly.  Tell your health care provider about any new moles or changes in moles, especially if there is a change in a mole's shape or color.  Also tell your health care provider if you have a mole that is larger than the size of a pencil eraser.  Always use sunscreen. Apply sunscreen liberally and repeatedly throughout the day.  Protect yourself by wearing long sleeves, pants, a wide-brimmed hat, and sunglasses whenever you are outside. HEART DISEASE, DIABETES, AND HIGH BLOOD PRESSURE   High blood pressure causes heart disease and increases the risk of stroke. High blood pressure is more likely to develop in:  People who have blood pressure in the high end   of the normal range (130-139/85-89 mm Hg).  People who are overweight or obese.  People who are African American.  If you are 38-23 years of age, have your blood pressure checked every 3-5 years. If you are 61 years of age or older, have your blood pressure checked every year. You should have your blood pressure measured twice--once when you are at a hospital or clinic, and once when you are not at a hospital or clinic. Record the average of the two measurements. To check your blood pressure when you are not at a hospital or clinic, you can use:  An automated blood pressure machine at a pharmacy.  A home blood pressure monitor.  If you are between 45 years and 39 years old, ask your health care provider if you should take aspirin to prevent strokes.  Have regular diabetes screenings. This involves taking a blood sample to check your fasting blood sugar level.  If you are at a normal weight and have a low risk for diabetes,  have this test once every three years after 31 years of age.  If you are overweight and have a high risk for diabetes, consider being tested at a younger age or more often. PREVENTING INFECTION  Hepatitis B  If you have a higher risk for hepatitis B, you should be screened for this virus. You are considered at high risk for hepatitis B if:  You were born in a country where hepatitis B is common. Ask your health care provider which countries are considered high risk.  Your parents were born in a high-risk country, and you have not been immunized against hepatitis B (hepatitis B vaccine).  You have HIV or AIDS.  You use needles to inject street drugs.  You live with someone who has hepatitis B.  You have had sex with someone who has hepatitis B.  You get hemodialysis treatment.  You take certain medicines for conditions, including cancer, organ transplantation, and autoimmune conditions. Hepatitis C  Blood testing is recommended for:  Everyone born from [birth year] through 1967.  Anyone with known risk factors for hepatitis C. Sexually transmitted infections (STIs)  You should be screened for sexually transmitted infections (STIs) including gonorrhea and chlamydia if:  You are sexually active and are younger than 31 years of age.  You are older than 31 years of age and your health care provider tells you that you are at risk for this type of infection.  Your sexual activity has changed since you were last screened and you are at an increased risk for chlamydia or gonorrhea. Ask your health care provider if you are at risk.  If you do not have HIV, but are at risk, it may be recommended that you take a prescription medicine daily to prevent HIV infection. This is called pre-exposure prophylaxis (PrEP). You are considered at risk if:  You are sexually active and do not regularly use condoms or know the HIV status of your partner(s).  You take drugs by injection.  You are sexually  active with a partner who has HIV. Talk with your health care provider about whether you are at high risk of being infected with HIV. If you choose to begin PrEP, you should first be tested for HIV. You should then be tested every 3 months for as long as you are taking PrEP.  PREGNANCY   If you are premenopausal and you may become pregnant, ask your health care provider about preconception counseling.  If you may  become pregnant, take 400 to 800 micrograms (mcg) of folic acid every day.  If you want to prevent pregnancy, talk to your health care provider about birth control (contraception). OSTEOPOROSIS AND MENOPAUSE   Osteoporosis is a disease in which the bones lose minerals and strength with aging. This can result in serious bone fractures. Your risk for osteoporosis can be identified using a bone density scan.  If you are 61 years of age or older, or if you are at risk for osteoporosis and fractures, ask your health care provider if you should be screened.  Ask your health care provider whether you should take a calcium or vitamin D supplement to lower your risk for osteoporosis.  Menopause may have certain physical symptoms and risks.  Hormone replacement therapy may reduce some of these symptoms and risks. Talk to your health care provider about whether hormone replacement therapy is right for you.  HOME CARE INSTRUCTIONS   Schedule regular health, dental, and eye exams.  Stay current with your immunizations.   Do not use any tobacco products including cigarettes, chewing tobacco, or electronic cigarettes.  If you are pregnant, do not drink alcohol.  If you are breastfeeding, limit how much and how often you drink alcohol.  Limit alcohol intake to no more than 1 drink per day for nonpregnant women. One drink equals 12 ounces of beer, 5 ounces of wine, or 1 ounces of hard liquor.  Do not use street drugs.  Do not share needles.  Ask your health care provider for help if  you need support or information about quitting drugs.  Tell your health care provider if you often feel depressed.  Tell your health care provider if you have ever been abused or do not feel safe at home.   This information is not intended to replace advice given to you by your health care provider. Make sure you discuss any questions you have with your health care provider.   Document Released: 05/30/2011 Document Revised: 12/05/2014 Document Reviewed: 10/16/2013 Elsevier Interactive Patient Education [employer].

## 2015-10-28 LAB — CBC
Hematocrit: 39.8 % (ref 34.0–46.6)
Hemoglobin: 13.4 g/dL (ref 11.1–15.9)
MCH: 28.4 pg (ref 26.6–33.0)
MCHC: 33.7 g/dL (ref 31.5–35.7)
MCV: 84 fL (ref 79–97)
PLATELETS: 219 10*3/uL (ref 150–379)
RBC: 4.72 x10E6/uL (ref 3.77–5.28)
RDW: 12.8 % (ref 12.3–15.4)
WBC: 7.4 10*3/uL (ref 3.4–10.8)

## 2015-10-28 LAB — BASIC METABOLIC PANEL
BUN / CREAT RATIO: 20 (ref 8–20)
BUN: 12 mg/dL (ref 6–20)
CHLORIDE: 101 mmol/L (ref 97–106)
CO2: 24 mmol/L (ref 18–29)
Calcium: 9.2 mg/dL (ref 8.7–10.2)
Creatinine, Ser: 0.61 mg/dL (ref 0.57–1.00)
GFR calc non Af Amer: 122 mL/min/{1.73_m2} (ref >59)
GFR, EST AFRICAN AMERICAN: 141 mL/min/{1.73_m2} (ref >59)
GLUCOSE: 81 mg/dL (ref 65–99)
POTASSIUM: 4.3 mmol/L (ref 3.5–5.2)
Sodium: 140 mmol/L (ref 136–144)

## 2016-11-01 ENCOUNTER — Encounter: Payer: BLUE CROSS/BLUE SHIELD | Admitting: Obstetrics and Gynecology

## 2017-01-06 NOTE — Progress Notes (Signed)
GYNECOLOGY ANNUAL EXAM CLINIC PROGRESS NOTE   Subjective:    Beth Rubio is a 31 y.o. [phone] female who presents for an annual exam. The patient has no complaints today. The patient is sexually active, 1 partner (spouse).  The patient wears seatbelts: yes. The patient participates in regular exercise: no. Has the patient ever been transfused or tattooed?: no. The patient reports that there is not domestic violence in her life.   Menstrual History: Menarche age:37 Patient's last menstrual period was 12/14/2016. Last pap: approximate date 06/2014 and was normal. Denies h/o abnormal pap smears or STIs.  Contraception: None    OB History  Gravida Para Term Preterm AB Living  3 2 2   1 2   SAB TAB Ectopic Multiple Live Births  1     0 2    # Outcome Date GA Lbr Len/2nd Weight Sex Delivery Anes PTL Lv  3 Term 06/09/15 [redacted]w[redacted]d 8 lb 3.9 oz (3.74 kg) M    LIV  2 Term 04/29/08 475w2d7 lb 9 oz (3.43 kg) M CS-LTranv   LIV  1 SAB      SAB   FD      Past Medical History:  Diagnosis Date  . Hypotension     Family History  Problem Relation Age of Onset  . Stroke Mother   . Breast cancer? Mother     Past Surgical History  Procedure Laterality Date  . Cesarean section  2009, 2016     Social History   Social History  . Marital status: Married    Spouse name: N/A  . Number of children: N/A  . Years of education: N/A   Occupational History  . Not on file.   Social History Main Topics  . Smoking status: Never Smoker  . Smokeless tobacco: Never Used  . Alcohol use No  . Drug use: No  . Sexual activity: Yes    Birth control/ protection: None   Other Topics Concern  . Not on file   Social History Narrative  . No narrative on file    No current outpatient prescriptions on file prior to visit.   No current facility-administered medications on file prior to visit.     Allergies  Allergen Reactions  . Hydrocodone Nausea Only    Review of  Systems Constitutional: negative for chills, fatigue, fevers and sweats Eyes: negative for irritation, redness and visual disturbance Ears, nose, mouth, throat, and face: negative for hearing loss, nasal congestion, snoring and tinnitus Respiratory: negative for asthma, cough, sputum Cardiovascular: negative for chest pain, dyspnea, exertional chest pressure/discomfort, irregular heart beat, palpitations and syncope.   Gastrointestinal: negative for abdominal pain, change in bowel habits, nausea and vomiting Genitourinary: negative for abnormal menstrual periods, genital lesions, sexual problems and vaginal discharge, dysuria and urinary incontinence Integument/breast: negative for breast lump, breast tenderness and nipple discharge.   Hematologic/lymphatic: negative for bleeding and easy bruising Musculoskeletal:negative for back pain and muscle weakness Neurological: negative for dizziness, headaches, vertigo and weakness Endocrine: negative for diabetic symptoms including polydipsia, polyuria and skin dryness.  Allergic/Immunologic: negative for hay fever and urticaria     Objective:  Blood pressure 105/70, pulse (!) 104, height 5' (1.524 m), weight 157 lb 11.2 oz (71.5 kg), last menstrual period 12/14/2016, not currently breastfeeding. Body mass index is 30.8 kg/m.    General Appearance:    Alert, cooperative, no distress, appears stated age, mild obesity  Head:    Normocephalic, without obvious  abnormality, atraumatic  Eyes:    PERRL, conjunctiva/corneas clear, EOM's intact, both eyes  Ears:    Normal external ear canals, both ears  Nose:   Nares normal, septum midline, mucosa normal, no drainage or sinus tenderness  Throat:   Lips, mucosa, and tongue normal; teeth and gums normal  Neck:   Supple, symmetrical, trachea midline, no adenopathy; thyroid: no enlargement/tenderness/nodules; no carotid bruit or JVD  Back:     Symmetric, no curvature, ROM normal, no CVA tenderness  Lungs:      Clear to auscultation bilaterally, respirations unlabored  Chest Wall:    No tenderness or deformity   Heart:    Regular rate and rhythm, S1 and S2 normal, no murmur, rub or gallop  Breast Exam:    No tenderness, masses, or nipple abnormality  Abdomen:     Soft, non-tender, bowel sounds active all four quadrants, no masses, no organomegaly.   Genitalia:    Pelvic:external genitalia normal, vagina without lesions, discharge, or tenderness, rectovaginal septum  normal. Cervix normal in appearance, no cervical motion tenderness, no adnexal masses or tenderness. Uterus normal size, shape, consistency.    Rectal:    Normal external sphincter.  No hemorrhoids appreciated. Internal exam not done.   Extremities:   Extremities normal, atraumatic, no cyanosis or edema  Pulses:   2+ and symmetric all extremities  Skin:   Skin color, texture, turgor normal, or lesions.    Lymph nodes:   Cervical, supraclavicular, and axillary nodes normal  Neurologic:   CNII-XII intact, normal strength, sensation and reflexes throughout      Assessment:    Healthy female exam.   Obesity Class I Family h/o breast cancer (mother) Contraception management.    Plan:     Blood tests: Basic metabolic panel, CBC with diff, Lipid panel, and Vitamin D level Breast self exam technique reviewed and patient encouraged to perform self-exam monthly. Contraception: None.  Patient considering Nuvaring.  Given sample to use.  To initiate after next menses. If patient ok with method, will also call in prescription. Advised on backup method until then.  Discussed healthy lifestyle modifications. Family h/o breast cancer (mother) - patient unsure if it was truly cancer, notes her mother told her that it was a blocked milk duct that required a lumpectomy, but was followed by radiation therapy.  Also reports that her maternal grandmother has a questionable history of ovarian cancer.  Patient inquires about BRCA testing.  Will refer to  genetic counseling.  Received flu vaccine in October 2018 at job.  Follow up in 1 year for annual exam, or sooner as needed.        Rubie Maid, MD [facility]

## 2017-01-10 ENCOUNTER — Ambulatory Visit (INDEPENDENT_AMBULATORY_CARE_PROVIDER_SITE_OTHER): Payer: BLUE CROSS/BLUE SHIELD | Admitting: Obstetrics and Gynecology

## 2017-01-10 ENCOUNTER — Encounter: Payer: Self-pay | Admitting: Obstetrics and Gynecology

## 2017-01-10 VITALS — BP 105/70 | HR 104 | Ht 60.0 in | Wt 157.7 lb

## 2017-01-10 DIAGNOSIS — E669 Obesity, unspecified: Secondary | ICD-10-CM | POA: Diagnosis not present

## 2017-01-10 DIAGNOSIS — Z13220 Encounter for screening for lipoid disorders: Secondary | ICD-10-CM | POA: Diagnosis not present

## 2017-01-10 DIAGNOSIS — Z01419 Encounter for gynecological examination (general) (routine) without abnormal findings: Secondary | ICD-10-CM | POA: Diagnosis not present

## 2017-01-10 DIAGNOSIS — Z803 Family history of malignant neoplasm of breast: Secondary | ICD-10-CM

## 2017-01-10 MED ORDER — ETONOGESTREL-ETHINYL ESTRADIOL 0.12-0.015 MG/24HR VA RING: VAGINAL_RING | VAGINAL | 12 refills | Status: DC

## 2017-01-10 NOTE — Patient Instructions (Signed)

## 2017-01-11 LAB — CBC WITH DIFFERENTIAL/PLATELET
BASOS ABS: 0 10*3/uL (ref 0.0–0.2)
Basos: 0 %
EOS (ABSOLUTE): 0.1 10*3/uL (ref 0.0–0.4)
EOS: 1 %
HEMATOCRIT: 41.6 % (ref 34.0–46.6)
Hemoglobin: 13.4 g/dL (ref 11.1–15.9)
Immature Grans (Abs): 0 10*3/uL (ref 0.0–0.1)
Immature Granulocytes: 0 %
LYMPHS ABS: 2.8 10*3/uL (ref 0.7–3.1)
Lymphs: 33 %
MCH: 27.5 pg (ref 26.6–33.0)
MCHC: 32.2 g/dL (ref 31.5–35.7)
MCV: 85 fL (ref 79–97)
Monocytes Absolute: 0.5 10*3/uL (ref 0.1–0.9)
Monocytes: 6 %
Neutrophils Absolute: 5.2 10*3/uL (ref 1.4–7.0)
Neutrophils: 60 %
Platelets: 205 10*3/uL (ref 150–379)
RBC: 4.87 x10E6/uL (ref 3.77–5.28)
RDW: 13.1 % (ref 12.3–15.4)
WBC: 8.5 10*3/uL (ref 3.4–10.8)

## 2017-01-11 LAB — BASIC METABOLIC PANEL
BUN/Creatinine Ratio: 14 (ref 9–23)
BUN: 9 mg/dL (ref 6–20)
CO2: 23 mmol/L (ref 18–29)
Calcium: 9.7 mg/dL (ref 8.7–10.2)
Chloride: 102 mmol/L (ref 96–106)
Creatinine, Ser: 0.64 mg/dL (ref 0.57–1.00)
GFR calc Af Amer: 137 mL/min/{1.73_m2} (ref >59)
GFR, EST NON AFRICAN AMERICAN: 118 mL/min/{1.73_m2} (ref >59)
Glucose: 90 mg/dL (ref 65–99)
Potassium: 4.6 mmol/L (ref 3.5–5.2)
SODIUM: 141 mmol/L (ref 134–144)

## 2017-01-11 LAB — LIPID PANEL
CHOL/HDL RATIO: 3.3 ratio (ref 0.0–4.4)
CHOLESTEROL TOTAL: 140 mg/dL (ref 100–199)
HDL: 43 mg/dL (ref >39)
LDL Calculated: 74 mg/dL (ref 0–99)
Triglycerides: 113 mg/dL (ref 0–149)
VLDL Cholesterol Cal: 23 mg/dL (ref 5–40)

## 2017-01-11 LAB — VITAMIN D 25 HYDROXY (VIT D DEFICIENCY, FRACTURES): Vit D, 25-Hydroxy: 26.9 ng/mL — ABNORMAL LOW (ref 30.0–100.0)

## 2017-02-07 ENCOUNTER — Telehealth: Payer: Self-pay | Admitting: Obstetrics and Gynecology

## 2017-02-07 NOTE — Telephone Encounter (Signed)
error 

## 2017-02-14 ENCOUNTER — Encounter: Payer: BLUE CROSS/BLUE SHIELD | Admitting: Oncology

## 2017-03-13 ENCOUNTER — Other Ambulatory Visit: Payer: Self-pay

## 2017-03-13 DIAGNOSIS — Z30018 Encounter for initial prescription of other contraceptives: Secondary | ICD-10-CM

## 2017-03-13 MED ORDER — ETONOGESTREL-ETHINYL ESTRADIOL 0.12-0.015 MG/24HR VA RING: VAGINAL_RING | VAGINAL | 3 refills | Status: AC

## 2017-05-17 DIAGNOSIS — Z1379 Encounter for other screening for genetic and chromosomal anomalies: Secondary | ICD-10-CM | POA: Insufficient documentation

## 2017-05-17 NOTE — Progress Notes (Deleted)
  [facility]  Telephone:(336) [phone] Fax:(336) [phone]  ID: Beth KailRebecca M Prestia OB: 10/29/1986  MR#: [id number]  ION#:[id number]#:[id number]  Patient Care Team: Duanne LimerickJones, Deanna C, MD as PCP - General (Family Medicine)  CHIEF COMPLAINT: Genetic counseling and testing.  INTERVAL HISTORY: ***  REVIEW OF SYSTEMS:   ROS  As per HPI. Otherwise, a complete review of systems is negative.  PAST MEDICAL HISTORY: Past Medical History:  Diagnosis Date  . Hypotension   . Medical history non-contributory     PAST SURGICAL HISTORY: Past Surgical History:  Procedure Laterality Date  . CESAREAN SECTION  2009  . CESAREAN SECTION N/A 06/08/2015   Procedure: CESAREAN SECTION;  Surgeon: Hildred LaserAnika Cherry, MD;  Location: [facility] ORS;  Service: Obstetrics;  Laterality: N/A;    FAMILY HISTORY: Family History  Problem Relation Age of Onset  . Stroke Mother   . Breast cancer Mother     ADVANCED DIRECTIVES (Y/N):  N  HEALTH MAINTENANCE: Social History  Substance Use Topics  . Smoking status: Never Smoker  . Smokeless tobacco: Never Used  . Alcohol use No     Colonoscopy:  PAP:  Bone density:  Lipid panel:  Allergies  Allergen Reactions  . Hydrocodone Nausea Only    Current Outpatient Prescriptions  Medication Sig Dispense Refill  . etonogestrel-ethinyl estradiol (NUVARING) 0.12-0.015 MG/24HR vaginal ring Insert vaginally and leave in place for 3 consecutive weeks, then remove for 1 week. 3 each 3   No current facility-administered medications for this visit.     OBJECTIVE: There were no vitals filed for this visit.   There is no height or weight on file to calculate BMI.    ECOG FS:{CHL ONC [phone]}  General: Well-developed, well-nourished, no acute distress. Eyes: Pink conjunctiva, anicteric sclera. HEENT: Normocephalic, moist mucous membranes, clear oropharnyx. Lungs: Clear to auscultation bilaterally. Heart: Regular rate and rhythm. No rubs, murmurs, or  gallops. Abdomen: Soft, nontender, nondistended. No organomegaly noted, normoactive bowel sounds. Musculoskeletal: No edema, cyanosis, or clubbing. Neuro: Alert, answering all questions appropriately. Cranial nerves grossly intact. Skin: No rashes or petechiae noted. Psych: Normal affect. Lymphatics: No cervical, calvicular, axillary or inguinal LAD.   LAB RESULTS:  Lab Results  Component Value Date   NA 141 01/10/2017   K 4.6 01/10/2017   CL 102 01/10/2017   CO2 23 01/10/2017   GLUCOSE 90 01/10/2017   BUN 9 01/10/2017   CREATININE 0.64 01/10/2017   CALCIUM 9.7 01/10/2017   GFRNONAA 118 01/10/2017   GFRAA 137 01/10/2017    Lab Results  Component Value Date   WBC 8.5 01/10/2017   NEUTROABS 5.2 01/10/2017   HGB 13.4 01/10/2017   HCT 41.6 01/10/2017   MCV 85 01/10/2017   PLT 205 01/10/2017     STUDIES: No results found.  ASSESSMENT: Genetic counseling and testing.  PLAN:  1. Genetic counseling and testing:    Patient expressed understanding and was in agreement with this plan. She also understands that She can call clinic at any time with any questions, concerns, or complaints.   Cancer Staging No matching staging information was found for the patient.  Jeralyn Ruthsimothy J Finnegan, MD   05/17/2017 2:57 PM

## 2017-05-18 ENCOUNTER — Inpatient Hospital Stay: Payer: BLUE CROSS/BLUE SHIELD | Admitting: Oncology

## 2018-01-11 ENCOUNTER — Encounter: Payer: BLUE CROSS/BLUE SHIELD | Admitting: Obstetrics and Gynecology

## 2023-08-14 ENCOUNTER — Other Ambulatory Visit: Payer: Self-pay | Admitting: Family

## 2023-08-14 DIAGNOSIS — N63 Unspecified lump in unspecified breast: Secondary | ICD-10-CM
